# Patient Record
Sex: Male | Born: 1975 | Race: Black or African American | Hispanic: No | Marital: Single | Smoking: Current every day smoker
Health system: Southern US, Community
[De-identification: ages and names within clinical notes are randomized; demographics above are authoritative.]

## PROBLEM LIST (undated history)

## (undated) HISTORY — PX: LEG SURGERY: SHX1003

---

## 1999-01-03 ENCOUNTER — Emergency Department (HOSPITAL_COMMUNITY): Admission: EM | Admit: 1999-01-03 | Discharge: 1999-01-03 | Payer: Self-pay | Admitting: Emergency Medicine

## 1999-01-04 ENCOUNTER — Encounter: Payer: Self-pay | Admitting: Emergency Medicine

## 2003-05-29 ENCOUNTER — Emergency Department (HOSPITAL_COMMUNITY): Admission: EM | Admit: 2003-05-29 | Discharge: 2003-05-29 | Payer: Self-pay | Admitting: Family Medicine

## 2003-08-10 ENCOUNTER — Emergency Department (HOSPITAL_COMMUNITY): Admission: EM | Admit: 2003-08-10 | Discharge: 2003-08-10 | Payer: Self-pay | Admitting: Family Medicine

## 2003-11-13 ENCOUNTER — Emergency Department (HOSPITAL_COMMUNITY): Admission: EM | Admit: 2003-11-13 | Discharge: 2003-11-13 | Payer: Self-pay | Admitting: Emergency Medicine

## 2006-07-24 ENCOUNTER — Emergency Department (HOSPITAL_COMMUNITY): Admission: EM | Admit: 2006-07-24 | Discharge: 2006-07-24 | Payer: Self-pay | Admitting: Family Medicine

## 2006-07-26 ENCOUNTER — Emergency Department (HOSPITAL_COMMUNITY): Admission: EM | Admit: 2006-07-26 | Discharge: 2006-07-26 | Payer: Self-pay | Admitting: Family Medicine

## 2010-06-16 ENCOUNTER — Inpatient Hospital Stay (INDEPENDENT_AMBULATORY_CARE_PROVIDER_SITE_OTHER)
Admission: RE | Admit: 2010-06-16 | Discharge: 2010-06-16 | Disposition: A | Discharge status: Home / Self Care | Payer: Self-pay | Source: Ambulatory Visit | Attending: Family Medicine | Admitting: Family Medicine

## 2010-06-16 DIAGNOSIS — K0510 Chronic gingivitis, plaque induced: Secondary | ICD-10-CM

## 2010-06-16 DIAGNOSIS — K055 Other periodontal diseases: Secondary | ICD-10-CM

## 2010-10-11 ENCOUNTER — Emergency Department (HOSPITAL_COMMUNITY): Payer: Self-pay

## 2010-10-11 ENCOUNTER — Inpatient Hospital Stay (HOSPITAL_COMMUNITY)
Admission: EM | Admit: 2010-10-11 | Discharge: 2010-10-14 | DRG: 981 | Disposition: A | Discharge status: Home / Self Care | Payer: Self-pay | Attending: Surgery | Admitting: Surgery

## 2010-10-11 DIAGNOSIS — F17200 Nicotine dependence, unspecified, uncomplicated: Secondary | ICD-10-CM | POA: Diagnosis present

## 2010-10-11 DIAGNOSIS — S72443B Displaced fracture of lower epiphysis (separation) of unspecified femur, initial encounter for open fracture type I or II: Secondary | ICD-10-CM | POA: Diagnosis present

## 2010-10-11 DIAGNOSIS — F1010 Alcohol abuse, uncomplicated: Secondary | ICD-10-CM | POA: Diagnosis present

## 2010-10-11 DIAGNOSIS — S82109B Unspecified fracture of upper end of unspecified tibia, initial encounter for open fracture type I or II: Secondary | ICD-10-CM | POA: Diagnosis present

## 2010-10-11 DIAGNOSIS — F1210 Cannabis abuse, uncomplicated: Secondary | ICD-10-CM | POA: Diagnosis present

## 2010-10-11 DIAGNOSIS — S82209A Unspecified fracture of shaft of unspecified tibia, initial encounter for closed fracture: Secondary | ICD-10-CM

## 2010-10-11 DIAGNOSIS — S7290XA Unspecified fracture of unspecified femur, initial encounter for closed fracture: Secondary | ICD-10-CM

## 2010-10-11 DIAGNOSIS — D62 Acute posthemorrhagic anemia: Secondary | ICD-10-CM | POA: Diagnosis present

## 2010-10-11 DIAGNOSIS — S82409A Unspecified fracture of shaft of unspecified fibula, initial encounter for closed fracture: Secondary | ICD-10-CM

## 2010-10-11 DIAGNOSIS — S81009A Unspecified open wound, unspecified knee, initial encounter: Principal | ICD-10-CM | POA: Diagnosis present

## 2010-10-11 DIAGNOSIS — Y998 Other external cause status: Secondary | ICD-10-CM

## 2010-10-11 LAB — TYPE AND SCREEN

## 2010-10-11 LAB — POCT I-STAT, CHEM 8
Creatinine, Ser: 1.6 mg/dL — ABNORMAL HIGH (ref 0.50–1.35)
Glucose, Bld: 99 mg/dL (ref 70–99)
Hemoglobin: 16 g/dL (ref 13.0–17.0)
TCO2: 21 mmol/L (ref 0–100)

## 2010-10-12 ENCOUNTER — Inpatient Hospital Stay (HOSPITAL_COMMUNITY): Payer: Self-pay

## 2010-10-12 ENCOUNTER — Emergency Department (HOSPITAL_COMMUNITY): Payer: Self-pay

## 2010-10-12 LAB — CBC
HCT: 35.7 % — ABNORMAL LOW (ref 39.0–52.0)
MCHC: 33.6 g/dL (ref 30.0–36.0)
RDW: 12.8 % (ref 11.5–15.5)

## 2010-10-12 LAB — BASIC METABOLIC PANEL
BUN: 13 mg/dL (ref 6–23)
Creatinine, Ser: 1.19 mg/dL (ref 0.50–1.35)
GFR calc Af Amer: >60 mL/min (ref >60)
GFR calc non Af Amer: >60 mL/min (ref >60)

## 2010-10-12 LAB — SURGICAL PCR SCREEN
MRSA, PCR: NEGATIVE
Staphylococcus aureus: POSITIVE — AB

## 2010-10-13 LAB — BASIC METABOLIC PANEL
BUN: 9 mg/dL (ref 6–23)
Calcium: 8.1 mg/dL — ABNORMAL LOW (ref 8.4–10.5)
GFR calc Af Amer: >60 mL/min (ref >60)
GFR calc non Af Amer: >60 mL/min (ref >60)
Potassium: 3.8 mEq/L (ref 3.5–5.1)

## 2010-10-13 LAB — CBC
HCT: 27.6 % — ABNORMAL LOW (ref 39.0–52.0)
MCHC: 35.5 g/dL (ref 30.0–36.0)
RDW: 13 % (ref 11.5–15.5)

## 2010-10-14 LAB — CBC
Hemoglobin: 9.5 g/dL — ABNORMAL LOW (ref 13.0–17.0)
MCHC: 34.4 g/dL (ref 30.0–36.0)
Platelets: 163 10*3/uL (ref 150–400)
RDW: 12.7 % (ref 11.5–15.5)
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
## 2010-10-29 ENCOUNTER — Emergency Department (HOSPITAL_COMMUNITY)
Admission: EM | Admit: 2010-10-29 | Discharge: 2010-10-29 | Disposition: A | Discharge status: Home / Self Care | Payer: Self-pay | Attending: Emergency Medicine | Admitting: Emergency Medicine

## 2010-10-29 DIAGNOSIS — Z9889 Other specified postprocedural states: Secondary | ICD-10-CM | POA: Insufficient documentation

## 2010-10-29 DIAGNOSIS — Z79899 Other long term (current) drug therapy: Secondary | ICD-10-CM | POA: Insufficient documentation

## 2010-10-29 DIAGNOSIS — M79609 Pain in unspecified limb: Secondary | ICD-10-CM | POA: Insufficient documentation

## 2010-10-30 ENCOUNTER — Other Ambulatory Visit: Payer: Self-pay | Admitting: Orthopedic Surgery

## 2010-10-30 ENCOUNTER — Encounter (INDEPENDENT_AMBULATORY_CARE_PROVIDER_SITE_OTHER): Payer: Self-pay | Admitting: *Deleted

## 2010-10-30 DIAGNOSIS — M79609 Pain in unspecified limb: Secondary | ICD-10-CM

## 2010-10-30 DIAGNOSIS — R609 Edema, unspecified: Secondary | ICD-10-CM
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
## 2010-10-31 ENCOUNTER — Telehealth (INDEPENDENT_AMBULATORY_CARE_PROVIDER_SITE_OTHER): Payer: Self-pay | Admitting: Physician Assistant

## 2010-10-31 NOTE — Telephone Encounter (Signed)
[facility] RN, Zella Ball called to get approval for additional RN visit and approval was given

## 2010-11-12 LAB — POCT RAPID STREP A: Streptococcus, Group A Screen (Direct): NEGATIVE

## 2010-11-12 LAB — CBC
Hemoglobin: 15
Platelets: 190
RDW: 14

## 2010-11-12 LAB — DIFFERENTIAL
Basophils Absolute: 0
Basophils Relative: 0
Lymphocytes Relative: 12
Neutro Abs: 6.9
Neutrophils Relative %: 72

## 2010-11-12 LAB — POCT INFECTIOUS MONO SCREEN: Mono Screen: NEGATIVE

## 2011-01-21 ENCOUNTER — Ambulatory Visit: Payer: Self-pay | Admitting: Rehabilitation

## 2011-02-03 ENCOUNTER — Ambulatory Visit: Payer: Self-pay | Attending: Orthopedic Surgery

## 2011-02-03 DIAGNOSIS — R262 Difficulty in walking, not elsewhere classified: Secondary | ICD-10-CM | POA: Insufficient documentation

## 2011-02-03 DIAGNOSIS — M25669 Stiffness of unspecified knee, not elsewhere classified: Secondary | ICD-10-CM | POA: Insufficient documentation

## 2011-02-03 DIAGNOSIS — M25569 Pain in unspecified knee: Secondary | ICD-10-CM | POA: Insufficient documentation

## 2011-02-03 DIAGNOSIS — IMO0001 Reserved for inherently not codable concepts without codable children: Secondary | ICD-10-CM | POA: Insufficient documentation

## 2011-02-16 ENCOUNTER — Ambulatory Visit: Payer: Self-pay | Admitting: Physical Therapy

## 2011-02-24 ENCOUNTER — Ambulatory Visit: Payer: Self-pay | Admitting: Physical Therapy

## 2011-02-26 ENCOUNTER — Ambulatory Visit: Payer: Self-pay | Admitting: Physical Therapy

## 2011-03-03 ENCOUNTER — Ambulatory Visit: Payer: Self-pay | Attending: Orthopedic Surgery | Admitting: Physical Therapy

## 2011-03-03 DIAGNOSIS — R262 Difficulty in walking, not elsewhere classified: Secondary | ICD-10-CM | POA: Insufficient documentation

## 2011-03-03 DIAGNOSIS — M25569 Pain in unspecified knee: Secondary | ICD-10-CM | POA: Insufficient documentation

## 2011-03-03 DIAGNOSIS — M25669 Stiffness of unspecified knee, not elsewhere classified: Secondary | ICD-10-CM | POA: Insufficient documentation

## 2011-03-03 DIAGNOSIS — IMO0001 Reserved for inherently not codable concepts without codable children: Secondary | ICD-10-CM | POA: Insufficient documentation

## 2011-03-05 ENCOUNTER — Ambulatory Visit: Payer: Self-pay | Admitting: Physical Therapy

## 2011-03-11 ENCOUNTER — Encounter: Payer: Self-pay | Admitting: Physical Therapy

## 2011-03-12 ENCOUNTER — Ambulatory Visit: Payer: Self-pay | Admitting: Physical Therapy

## 2011-03-16 ENCOUNTER — Ambulatory Visit: Payer: Self-pay | Admitting: Physical Therapy

## 2011-03-19 ENCOUNTER — Encounter: Payer: Self-pay | Admitting: Physical Therapy

## 2011-03-19 ENCOUNTER — Ambulatory Visit: Payer: Self-pay | Admitting: Physical Therapy

## 2011-03-24 ENCOUNTER — Ambulatory Visit: Payer: Self-pay | Admitting: Rehabilitation

## 2011-03-24 ENCOUNTER — Encounter: Payer: Self-pay | Admitting: Physical Therapy

## 2011-03-25 ENCOUNTER — Ambulatory Visit: Payer: Self-pay | Admitting: Rehabilitation

## 2011-03-26 ENCOUNTER — Encounter: Payer: Self-pay | Admitting: Physical Therapy

## 2011-03-26 ENCOUNTER — Encounter: Payer: Self-pay | Admitting: Rehabilitative and Restorative Service Providers"

## 2011-03-30 ENCOUNTER — Ambulatory Visit: Payer: Self-pay | Admitting: Physical Therapy

## 2011-03-31 ENCOUNTER — Ambulatory Visit: Payer: Self-pay | Attending: Orthopedic Surgery | Admitting: Physical Therapy

## 2011-03-31 DIAGNOSIS — R262 Difficulty in walking, not elsewhere classified: Secondary | ICD-10-CM | POA: Insufficient documentation

## 2011-03-31 DIAGNOSIS — M25569 Pain in unspecified knee: Secondary | ICD-10-CM | POA: Insufficient documentation

## 2011-03-31 DIAGNOSIS — IMO0001 Reserved for inherently not codable concepts without codable children: Secondary | ICD-10-CM | POA: Insufficient documentation

## 2011-03-31 DIAGNOSIS — M25669 Stiffness of unspecified knee, not elsewhere classified: Secondary | ICD-10-CM | POA: Insufficient documentation

## 2011-04-01 ENCOUNTER — Encounter: Payer: Self-pay | Admitting: Rehabilitation

## 2011-04-02 ENCOUNTER — Ambulatory Visit: Payer: Self-pay | Admitting: Rehabilitation

## 2011-04-06 ENCOUNTER — Ambulatory Visit: Payer: Self-pay | Admitting: Rehabilitation

## 2011-04-08 ENCOUNTER — Ambulatory Visit: Payer: Self-pay | Admitting: Rehabilitation

## 2011-04-13 ENCOUNTER — Ambulatory Visit: Payer: Self-pay | Admitting: Rehabilitation

## 2011-04-15 ENCOUNTER — Encounter: Payer: Self-pay | Admitting: Rehabilitation

## 2013-06-30 ENCOUNTER — Emergency Department (INDEPENDENT_AMBULATORY_CARE_PROVIDER_SITE_OTHER)
Admission: EM | Admit: 2013-06-30 | Discharge: 2013-06-30 | Disposition: A | Discharge status: Home / Self Care | Payer: Self-pay | Source: Home / Self Care | Attending: Emergency Medicine | Admitting: Emergency Medicine

## 2013-06-30 ENCOUNTER — Encounter (HOSPITAL_COMMUNITY): Payer: Self-pay | Admitting: Emergency Medicine

## 2013-06-30 DIAGNOSIS — M25561 Pain in right knee: Secondary | ICD-10-CM

## 2013-06-30 DIAGNOSIS — M25569 Pain in unspecified knee: Secondary | ICD-10-CM

## 2013-06-30 DIAGNOSIS — B86 Scabies: Secondary | ICD-10-CM

## 2013-06-30 MED ORDER — PERMETHRIN 5 % EX CREA: 1.0000 "application " | TOPICAL_CREAM | Freq: Once | CUTANEOUS | Status: DC

## 2013-06-30 MED ORDER — IBUPROFEN 800 MG PO TABS
800.0000 mg | ORAL_TABLET | Freq: Once | ORAL | Status: AC
  Administered 2013-06-30: 800 mg via ORAL

## 2013-06-30 MED ORDER — IBUPROFEN 800 MG PO TABS
ORAL_TABLET | ORAL | Status: AC
  Filled 2013-06-30: qty 1
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
## 2013-06-30 NOTE — Discharge Instructions (Signed)
Wear ace wrap as needed for comfort. Use Ibuprofen 600-800 mg three times a day as needed for the pain. A heating pad may help as well. Use the Permethrin Cream as directed and repeat in 10-14 days as needed. See instructions below regarding home care after scabies infestation.  Knee Pain Knee pain can be a result of an injury or other medical conditions. Treatment will depend on the cause of your pain. HOME CARE  Only take medicine as told by your doctor.  Keep a healthy weight. Being overweight can make the knee hurt more.  Stretch before exercising or playing sports.  If there is constant knee pain, change the way you exercise. Ask your doctor for advice.  Make sure shoes fit well. Choose the right shoe for the sport or activity.  Protect your knees. Wear kneepads if needed.  Rest when you are tired. GET HELP RIGHT AWAY IF:   Your knee pain does not stop.  Your knee pain does not get better.  Your knee joint feels hot to the touch.  You have a fever. MAKE SURE YOU:   Understand these instructions.  Will watch this condition.  Will get help right away if you are not doing well or get worse. Document Released: 04/10/2008 Document Revised: 04/06/2011 Document Reviewed: 04/10/2008 [facility] LP[facility] Patient Information 2014 [employer], [place].  Scabies Scabies are small bugs (mites) that burrow under the skin and cause red bumps and severe itching. These bugs can only be seen with a microscope. Scabies are highly contagious. They can spread easily from person to person by direct contact. They are also spread through sharing clothing or linens that have the scabies mites living in them. It is not unusual for an entire family to become infected through shared towels, clothing, or bedding.  HOME CARE INSTRUCTIONS   Your caregiver may prescribe a cream or lotion to kill the mites. If cream is prescribed, massage the cream into the entire body from the neck to the bottom of both feet. Also  massage the cream into the scalp and face if your child is less than 36 year old. Avoid the eyes and mouth. Do not wash your hands after application.  Leave the cream on for 8 to 12 hours. Your child should bathe or shower after the 8 to 12 hour application period. Sometimes it is helpful to apply the cream to your child right before bedtime.  One treatment is usually effective and will eliminate approximately 95% of infestations. For severe cases, your caregiver may decide to repeat the treatment in 1 week. Everyone in your household should be treated with one application of the cream.  New rashes or burrows should not appear within 24 to 48 hours after successful treatment. However, the itching and rash may last for 2 to 4 weeks after successful treatment. Your caregiver may prescribe a medicine to help with the itching or to help the rash go away more quickly.  Scabies can live on clothing or linens for up to 3 days. All of your child's recently used clothing, towels, stuffed toys, and bed linens should be washed in hot water and then dried in a dryer for at least 20 minutes on high heat. Items that cannot be washed should be enclosed in a plastic bag for at least 3 days.  To help relieve itching, bathe your child in a cool bath or apply cool washcloths to the affected areas.  Your child may return to school after treatment with the prescribed cream.  SEEK MEDICAL CARE IF:   The itching persists longer than 4 weeks after treatment.  The rash spreads or becomes infected. Signs of infection include red blisters or yellow-tan crust. Document Released: 01/12/2005 Document Revised: 04/06/2011 Document Reviewed: 05/23/2008 [facility] Patient Information 2014 [place], [place].
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
## 2013-07-03 NOTE — ED Provider Notes (Signed)
Medical screening examination/treatment/procedure(s) were performed by non-physician practitioner and as supervising physician I was immediately available for consultation/collaboration.  Blayze Haen, M.D.  Agnes Brightbill C Denham Mose, MD 07/03/13 1344 

## 2013-11-06 ENCOUNTER — Encounter (HOSPITAL_COMMUNITY): Payer: Self-pay | Admitting: Emergency Medicine

## 2013-11-06 ENCOUNTER — Emergency Department (HOSPITAL_COMMUNITY)
Admission: EM | Admit: 2013-11-06 | Discharge: 2013-11-07 | Disposition: A | Discharge status: Home / Self Care | Payer: Self-pay | Attending: Emergency Medicine | Admitting: Emergency Medicine

## 2013-11-06 DIAGNOSIS — H209 Unspecified iridocyclitis: Secondary | ICD-10-CM | POA: Insufficient documentation

## 2013-11-06 DIAGNOSIS — Z720 Tobacco use: Secondary | ICD-10-CM | POA: Insufficient documentation

## 2013-11-06 MED ORDER — FLUORESCEIN SODIUM 1 MG OP STRP
1.0000 | ORAL_STRIP | Freq: Once | OPHTHALMIC | Status: AC
  Administered 2013-11-06: 1 via OPHTHALMIC
  Filled 2013-11-06: qty 1

## 2013-11-06 MED ORDER — HYDROCODONE-ACETAMINOPHEN 5-325 MG PO TABS
2.0000 | ORAL_TABLET | Freq: Once | ORAL | Status: AC
  Administered 2013-11-06: 2 via ORAL
  Filled 2013-11-06: qty 2

## 2013-11-06 MED ORDER — TETRACAINE HCL 0.5 % OP SOLN
2.0000 [drp] | Freq: Once | OPHTHALMIC | Status: AC
  Administered 2013-11-06: 2 [drp] via OPHTHALMIC
  Filled 2013-11-06: qty 2
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
## 2013-11-07 MED ORDER — HYDROCODONE-ACETAMINOPHEN 5-325 MG PO TABS: 1.0000 | ORAL_TABLET | Freq: Two times a day (BID) | ORAL | Status: DC | PRN
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
## 2014-11-13 ENCOUNTER — Encounter (HOSPITAL_COMMUNITY): Payer: Self-pay

## 2014-11-13 ENCOUNTER — Emergency Department (HOSPITAL_COMMUNITY)
Admission: EM | Admit: 2014-11-13 | Discharge: 2014-11-13 | Disposition: A | Discharge status: Home / Self Care | Payer: Self-pay | Attending: Emergency Medicine | Admitting: Emergency Medicine

## 2014-11-13 ENCOUNTER — Emergency Department (HOSPITAL_COMMUNITY): Payer: Self-pay

## 2014-11-13 ENCOUNTER — Emergency Department (HOSPITAL_COMMUNITY)
Admission: EM | Admit: 2014-11-13 | Discharge: 2014-11-13 | Disposition: A | Discharge status: Nursing Home (Includes ICF) | Payer: Self-pay | Source: Home / Self Care | Attending: Emergency Medicine | Admitting: Emergency Medicine

## 2014-11-13 ENCOUNTER — Encounter (HOSPITAL_COMMUNITY): Payer: Self-pay | Admitting: Emergency Medicine

## 2014-11-13 DIAGNOSIS — J36 Peritonsillar abscess: Secondary | ICD-10-CM | POA: Insufficient documentation

## 2014-11-13 DIAGNOSIS — Z720 Tobacco use: Secondary | ICD-10-CM | POA: Insufficient documentation

## 2014-11-13 DIAGNOSIS — Z79899 Other long term (current) drug therapy: Secondary | ICD-10-CM | POA: Insufficient documentation

## 2014-11-13 LAB — I-STAT CHEM 8, ED
BUN: 19 mg/dL (ref 6–20)
CALCIUM ION: 1.18 mmol/L (ref 1.12–1.23)
CHLORIDE: 103 mmol/L (ref 101–111)
Creatinine, Ser: 1 mg/dL (ref 0.61–1.24)
GLUCOSE: 87 mg/dL (ref 65–99)
HCT: 55 % — ABNORMAL HIGH (ref 39.0–52.0)
Hemoglobin: 18.7 g/dL — ABNORMAL HIGH (ref 13.0–17.0)
Potassium: 4.1 mmol/L (ref 3.5–5.1)
SODIUM: 143 mmol/L (ref 135–145)
TCO2: 28 mmol/L (ref 0–100)

## 2014-11-13 LAB — RAPID STREP SCREEN (MED CTR MEBANE ONLY): STREPTOCOCCUS, GROUP A SCREEN (DIRECT): POSITIVE — AB

## 2014-11-13 LAB — POCT RAPID STREP A: Streptococcus, Group A Screen (Direct): NEGATIVE

## 2014-11-13 MED ORDER — IOHEXOL 300 MG/ML  SOLN
75.0000 mL | Freq: Once | INTRAMUSCULAR | Status: AC | PRN
  Administered 2014-11-13: 75 mL via INTRAVENOUS

## 2014-11-13 MED ORDER — AMOXICILLIN 400 MG/5ML PO SUSR: ORAL | Status: AC

## 2014-11-13 MED ORDER — HYDROCODONE-ACETAMINOPHEN 7.5-325 MG/15ML PO SOLN: 15.0000 mL | Freq: Four times a day (QID) | ORAL | Status: AC | PRN

## 2014-11-13 MED ORDER — MORPHINE SULFATE (PF) 4 MG/ML IV SOLN
4.0000 mg | Freq: Once | INTRAVENOUS | Status: AC
Start: 2014-11-13 — End: 2014-11-13
  Administered 2014-11-13: 4 mg via INTRAVENOUS
  Filled 2014-11-13: qty 1

## 2014-11-13 MED ORDER — PENICILLIN G BENZATHINE 1200000 UNIT/2ML IM SUSP
1.2000 10*6.[IU] | Freq: Once | INTRAMUSCULAR | Status: AC
  Administered 2014-11-13: 1.2 10*6.[IU] via INTRAMUSCULAR
  Filled 2014-11-13: qty 2

## 2014-11-13 MED ORDER — ONDANSETRON HCL 4 MG/2ML IJ SOLN
4.0000 mg | Freq: Once | INTRAMUSCULAR | Status: AC
Start: 2014-11-13 — End: 2014-11-13
  Administered 2014-11-13: 4 mg via INTRAVENOUS
  Filled 2014-11-13: qty 2

## 2014-11-13 MED ORDER — DEXAMETHASONE SODIUM PHOSPHATE 10 MG/ML IJ SOLN
20.0000 mg | Freq: Once | INTRAMUSCULAR | Status: AC
  Administered 2014-11-13: 20 mg via INTRAVENOUS
  Filled 2014-11-13: qty 2

## 2014-11-13 MED ORDER — SODIUM CHLORIDE 0.9 % IV BOLUS (SEPSIS)
1000.0000 mL | Freq: Once | INTRAVENOUS | Status: AC
  Administered 2014-11-13: 1000 mL via INTRAVENOUS

## 2014-11-13 NOTE — ED Provider Notes (Signed)
CSN: [id number]     Arrival date & time 11/13/14  1627 History  By signing my name below, I, Austin Foster, attest that this documentation has been prepared under the direction and in the presence of non-physician practitioner, Austin CaptainAbigail Sunny Aguon, PA-C. Electronically Signed: Freida Busmaniana Foster, Scribe. 11/13/2014. 11:23 PM.    Chief Complaint  Patient presents with  . Sore Throat    The history is provided by the patient. No language interpreter was used.     HPI Comments:  Austin Foster is a 39 y.o. male with a h/o strep, who presents to the Emergency Department complaining of a sore throat with 8/10 pain, onset ~1 week ago. He states it seemed to be improving but worsened upon waking this AM. Pt reports greater pain on the right side and associated voice change. No alleviating factors noted. Pt was evaluated at an Urgent Care earlier today for his symptom and was diagnosed with peritonsillar and advised to come to the ED.   History reviewed. No pertinent past medical history. Past Surgical History  Procedure Laterality Date  . Leg surgery     No family history on file. Social History  Substance Use Topics  . Smoking status: Current Every Day Smoker  . Smokeless tobacco: None  . Alcohol Use: Yes    Review of Systems  Constitutional: Negative for fever and chills.  HENT: Positive for sore throat and voice change.   Respiratory: Negative for shortness of breath.   Cardiovascular: Negative for chest pain.    Allergies  Review of patient's allergies indicates no known allergies.  Home Medications   Prior to Admission medications   Medication Sig Start Date End Date Taking? Authorizing Provider  amoxicillin (AMOXIL) 400 MG/5ML suspension Take 12 mL 3 times a day for the first 3 days. Then take 6 mL 3 times a day for the next 7 days. 11/14/14 11/24/14  Austin CaptainAbigail Mailen Newborn, PA-C  diphenhydramine-acetaminophen (TYLENOL PM) 25-500 MG TABS Take 1 tablet by mouth at bedtime.     Historical Provider, MD  HYDROcodone-acetaminophen (HYCET) 7.5-325 mg/15 ml solution Take 15 mLs by mouth 4 (four) times daily as needed for moderate pain. 11/13/14 11/13/15  Austin CaptainAbigail Jynesis Nakamura, PA-C   BP 99/59 mmHg  Pulse 54  Temp(Src) 98.5 F (36.9 C) (Oral)  Resp 14  SpO2 99% Physical Exam  Constitutional: He is oriented to person, place, and time. He appears well-developed and well-nourished. No distress.  HENT:  Head: Normocephalic and atraumatic.  Peritonsillar abscess on the right; uvular deviation to the left, trismus, and unilateral swelling noted on the right. Pt is not tolerating secretions well; drooling  Eyes: Conjunctivae are normal.  Cardiovascular: Normal rate.   Pulmonary/Chest: Effort normal.  Abdominal: He exhibits no distension.  Neurological: He is alert and oriented to person, place, and time.  Skin: Skin is warm and dry.  Psychiatric: He has a normal mood and affect.  Nursing note and vitals reviewed.   ED Course  Procedures   DIAGNOSTIC STUDIES:  Oxygen Saturation is 100% on RA, normal by my interpretation.    COORDINATION OF CARE:  5:04 PM Will refer pt to ENT to have abscess drained. Discussed treatment plan with pt at bedside and pt agreed to plan.  Labs Review Labs Reviewed  RAPID STREP SCREEN (NOT AT [facility]) - Abnormal; Notable for the following:    Streptococcus, Group A Screen (Direct) POSITIVE (*)    All other components within normal limits  I-STAT CHEM 8, ED - Abnormal; Notable  for the following:    Hemoglobin 18.7 (*)    HCT 55.0 (*)    All other components within normal limits  CULTURE, GROUP A STREP    Imaging Review Ct Soft Tissue Neck W Contrast  11/13/2014  CLINICAL DATA:  Sore throat x1 week EXAM: CT NECK WITH CONTRAST TECHNIQUE: Multidetector CT imaging of the neck was performed using the standard protocol following the bolus administration of intravenous contrast. CONTRAST:  75mL OMNIPAQUE IOHEXOL 300 MG/ML  SOLN COMPARISON:   None. FINDINGS: Pharynx and larynx: 2.7 cm right peritonsillar low-attenuation loculated process probably abscess. There is some peripheral enhancement. This results in mild mass effect upon the regional oropharynx. Aryepiglottic folds are normal. Salivary glands: Symmetric, unremarkable. Thyroid: Normal Lymph nodes: No cervical adenopathy localized. Vascular: Normal Limited intracranial: Normal Visualized orbits: Normal Mastoids and visualized paranasal sinuses: Hypoplastic left frontal sinus. Otherwise negative. Skeleton: Streak artifact from dental restorations. Otherwise negative. Upper chest: Unremarkable IMPRESSION: 1. 2.7 cm multiloculated right peritonsillar abscess. Electronically Signed   By: Corlis Leak M.D.   On: 11/13/2014 18:35   I have personally reviewed and evaluated these images and lab results as part of my medical decision-making.   EKG Interpretation None      MDM   Final diagnoses:  Tonsillar abscess    Patient with apparent PTA. Will obtain CT. Pain meds and decadron given. His strep test is positive.   Filed Vitals:   11/13/14 1630 11/13/14 1804 11/13/14 2032  BP: 117/79 173/111 99/59  Pulse: 71 76 54  Temp: 99.5 F (37.5 C) 98.5 F (36.9 C)   TempSrc: Oral Oral   Resp: SpO2: 100% 99% 99%   I spoke with Dr. Lazarus Foster. Patient will be seen tomorrow in the office for iand d. Tolerating secretions. I believe pressure has dropped form pain meds.  Patient appears improved. A&O.   D/C with med orders per Dr. Lazarus Foster (see AVS.) Discussed reasons to seek immediate medical care in the office. Austin Foster, personally performed the services described in this documentation. All medical record entries made by the scribe were at my direction and in my presence.  I have reviewed the chart and discharge instructions and agree that the record reflects my personal performance and is accurate and complete. Austin Foster.  11/13/2014. 11:26 PM.        Austin Captain, PA-C 11/13/14 1610  Austin Hutching, MD 11/16/14 1546

## 2014-11-13 NOTE — Discharge Instructions (Signed)
Peritonsillar Abscess °A peritonsillar abscess is a collection of yellowish-white fluid (pus) in the back of the throat behind the tonsils. It usually occurs when an infection of the throat or tonsils (tonsillitis) spreads into the tissues around the tonsils. °CAUSES °The infection that leads to a peritonsillar abscess is usually caused by streptococcal bacteria.  °SIGNS AND SYMPTOMS °· Sore throat, often with pain on just one side. °· Swelling and tenderness of the glands (lymph nodes) in the neck. °· Difficulty swallowing. °· Difficulty opening your mouth. °· Fever. °· Chills. °· Drooling because of difficulty swallowing saliva. °· Headache. °· Changes in your voice. °· Bad breath. °DIAGNOSIS °Your health care provider will take your medical history and do a physical exam. Imaging tests may be done, such as an ultrasound or CT scan. A sample of pus may be removed from the abscess using a needle (needle aspiration) or by swabbing the back of your throat. This sample will be sent to a lab for testing. °TREATMENT °Treatment usually involves draining the pus from the abscess. This may be done through needle aspiration or by making an incision in the abscess. You will also likely need to take antibiotic medicine. °HOME CARE INSTRUCTIONS °· Rest as much as possible and get plenty of sleep. °· Take medicines only as directed by your health care provider. °· If you were prescribed an antibiotic medicine, finish it all even if you start to feel better. °· If your abscess was drained by your health care provider, gargle with a mixture of salt and warm water: °¨ Mix 1 tsp of salt in 8 oz of warm water. °¨ Gargle with this mixture four times per day or as needed for comfort. °¨ Do not swallow this mixture. °· Drink plenty of fluids. °· While your throat is sore, eat soft or liquid foods, such as frozen ice pops and ice cream. °· Keep all follow-up visits as directed by your health care provider. This is important. °SEEK  MEDICAL CARE IF: °· You have increased pain, swelling, redness, or drainage in your throat. °· You develop a headache, a lack of energy (lethargy), or generalized feelings of illness. °· You have a fever. °· You feel dizzy. °· You have difficulty swallowing or eating. °· You show signs of becoming dehydrated, such as: °¨ Light-headedness when standing. °¨ Decreased urine output. °¨ A fast heart rate. °¨ Dry mouth. °SEEK IMMEDIATE MEDICAL CARE IF:  °· You have difficulty talking or breathing, or you find it easier to breathe when you lean forward. °· You are coughing up blood or vomiting blood. °· You have severe throat pain that is not helped by medicines. °· You start to drool. °  °This information is not intended to replace advice given to you by your health care provider. Make sure you discuss any questions you have with your health care provider. °  °Document Released: 01/12/2005 Document Revised: 02/02/2014 Document Reviewed: 08/28/2013 °Elsevier Interactive Patient Education ©2016 [employer] ° °

## 2014-11-13 NOTE — ED Notes (Signed)
Pt reports sore throat x 1 week. Airway intact. Denies fever/chills.

## 2014-11-13 NOTE — ED Provider Notes (Signed)
CSN: [id number]     Arrival date & time 11/13/14  1524 History   First MD Initiated Contact with Patient 11/13/14 1537     Chief Complaint  Patient presents with  . Sore Throat   (Consider location/radiation/quality/duration/timing/severity/associated sxs/prior Treatment) HPI He is a 39 year old man here for evaluation of sore throat. He states the sore throat has been present for about a week. It was getting better, but became acutely worse this morning. He states he is able to swallow his saliva, but he is also spitting a lot. He denies any nasal congestion or rhinorrhea. No fevers or chills. No cough or shortness of breath. He does report some right ear pain.  History reviewed. No pertinent past medical history. Past Surgical History  Procedure Laterality Date  . Leg surgery     No family history on file. Social History  Substance Use Topics  . Smoking status: Current Every Day Smoker  . Smokeless tobacco: None  . Alcohol Use: Yes    Review of Systems As in history of present illness Allergies  Review of patient's allergies indicates no known allergies.  Home Medications   Prior to Admission medications   Medication Sig Start Date End Date Taking? Authorizing Provider  diphenhydramine-acetaminophen (TYLENOL PM) 25-500 MG TABS Take 1 tablet by mouth at bedtime.    Historical Provider, MD  HYDROcodone-acetaminophen (NORCO/VICODIN) 5-325 MG per tablet Take 1 tablet by mouth 2 (two) times daily as needed for severe pain. 11/07/13   Tomasita CrumbleAdeleke Oni, MD   Meds Ordered and Administered this Visit  Medications - No data to display  BP 110/71 mmHg  Pulse 66  Temp(Src) 99 F (37.2 C) (Oral)  Resp 24  SpO2 100% No data found.   Physical Exam  Constitutional: He is oriented to person, place, and time. He appears well-developed and well-nourished. No distress.  HENT:  Mouth/Throat: No oropharyngeal exudate.  There is significant swelling and erythema of the right anterior  tonsillar pillar. Left tonsil is mildly erythematous. Left TM is normal. Right TM is retracted.  Neck: Neck supple.  Cardiovascular: Normal rate.   Pulmonary/Chest: Effort normal.  Lymphadenopathy:    He has no cervical adenopathy.  Neurological: He is alert and oriented to person, place, and time.    ED Course  Procedures (including critical care time)  Labs Review Labs Reviewed  POCT RAPID STREP A    Imaging Review No results found.    MDM   1. Peritonsillar abscess    History and exam is concerning for peritonsillar abscess. There is no airway compromise at this time. [facility] additional evaluation and management.    Charm RingsErin J Janean Eischen, MD 11/13/14 [phone]

## 2014-11-13 NOTE — ED Notes (Signed)
Sore throat for a week.  Thought to be improving, but today woke with the worst sore throat.

## 2014-11-17 LAB — CULTURE, GROUP A STREP

## 2014-11-27 ENCOUNTER — Emergency Department (HOSPITAL_COMMUNITY)
Admission: EM | Admit: 2014-11-27 | Discharge: 2014-11-27 | Disposition: A | Discharge status: Home / Self Care | Payer: Self-pay | Attending: Emergency Medicine | Admitting: Emergency Medicine

## 2014-11-27 ENCOUNTER — Emergency Department (HOSPITAL_COMMUNITY): Payer: Self-pay

## 2014-11-27 ENCOUNTER — Encounter (HOSPITAL_COMMUNITY): Payer: Self-pay | Admitting: *Deleted

## 2014-11-27 DIAGNOSIS — Y9289 Other specified places as the place of occurrence of the external cause: Secondary | ICD-10-CM | POA: Insufficient documentation

## 2014-11-27 DIAGNOSIS — M25461 Effusion, right knee: Secondary | ICD-10-CM

## 2014-11-27 DIAGNOSIS — Y9389 Activity, other specified: Secondary | ICD-10-CM | POA: Insufficient documentation

## 2014-11-27 DIAGNOSIS — S8991XA Unspecified injury of right lower leg, initial encounter: Secondary | ICD-10-CM | POA: Insufficient documentation

## 2014-11-27 DIAGNOSIS — W1839XA Other fall on same level, initial encounter: Secondary | ICD-10-CM | POA: Insufficient documentation

## 2014-11-27 DIAGNOSIS — Z720 Tobacco use: Secondary | ICD-10-CM | POA: Insufficient documentation

## 2014-11-27 DIAGNOSIS — Z79899 Other long term (current) drug therapy: Secondary | ICD-10-CM | POA: Insufficient documentation

## 2014-11-27 DIAGNOSIS — Y998 Other external cause status: Secondary | ICD-10-CM | POA: Insufficient documentation

## 2014-11-27 MED ORDER — OXYCODONE-ACETAMINOPHEN 5-325 MG PO TABS
ORAL_TABLET | ORAL | Status: DC
Start: 2014-11-27 — End: 2014-11-27
  Filled 2014-11-27: qty 1

## 2014-11-27 MED ORDER — OXYCODONE-ACETAMINOPHEN 5-325 MG PO TABS
1.0000 | ORAL_TABLET | Freq: Once | ORAL | Status: AC
  Administered 2014-11-27: 1 via ORAL

## 2014-11-27 MED ORDER — RABIES VACCINE, PCEC IM SUSR: 1.0000 mL | Freq: Once | INTRAMUSCULAR | Status: DC

## 2014-11-27 NOTE — Discharge Instructions (Signed)
Knee Effusion °Knee effusion means that you have extra fluid in your knee. This can cause pain. Your knee may be more difficult to bend and move. °HOME CARE °· Use crutches as told by your doctor. °· Wear a knee brace as told by your doctor. °· Apply ice to the swollen area: °¨ Put ice in a plastic bag. °¨ Place a towel between your skin and the bag. °¨ Leave the ice on for 20 minutes, 2-3 times per day. °· Keep your knee raised (elevated) when you are sitting or lying down. °· Take medicines only as told by your doctor. °· Do any rehabilitation or strengthening exercises as told by your doctor. °· Rest your knee as told by your doctor. You may start doing your normal activities again when your doctor says it is okay. °· Keep all follow-up visits as told by your doctor. This is important. °GET HELP IF:  °· You continue to have pain in your knee. °GET HELP RIGHT AWAY IF: °· You have increased swelling or redness of your knee. °· You have severe pain in your knee. °· You have a fever. °  °This information is not intended to replace advice given to you by your health care provider. Make sure you discuss any questions you have with your health care provider. °  °Document Released: 02/14/2010 Document Revised: 02/02/2014 Document Reviewed: 08/28/2013 °Elsevier Interactive Patient Education ©2016 Elsevier Inc. ° °
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
## 2014-12-05 ENCOUNTER — Ambulatory Visit: Payer: Self-pay

## 2017-09-28 ENCOUNTER — Encounter (HOSPITAL_COMMUNITY): Payer: Self-pay | Admitting: Emergency Medicine

## 2017-09-28 ENCOUNTER — Ambulatory Visit (HOSPITAL_COMMUNITY)
Admission: EM | Admit: 2017-09-28 | Discharge: 2017-09-28 | Disposition: A | Discharge status: Home / Self Care | Payer: Self-pay | Attending: Family Medicine | Admitting: Family Medicine

## 2017-09-28 DIAGNOSIS — H1032 Unspecified acute conjunctivitis, left eye: Secondary | ICD-10-CM

## 2017-09-28 MED ORDER — OFLOXACIN 0.3 % OP SOLN: OPHTHALMIC | 0 refills | Status: AC

## 2017-09-28 MED ORDER — POLYETHYL GLYCOL-PROPYL GLYCOL 0.4-0.3 % OP GEL: 1.0000 "application " | Freq: Every evening | OPHTHALMIC | 0 refills | Status: AC | PRN

## 2017-09-28 MED ORDER — TETRACAINE HCL 0.5 % OP SOLN
OPHTHALMIC | Status: AC
Start: 2017-09-28 — End: ?
  Filled 2017-09-28: qty 4

## 2017-09-28 NOTE — ED Triage Notes (Signed)
Pt here for redness and irritation to left eye 

## 2017-09-28 NOTE — ED Provider Notes (Signed)
[facility]    CSN: [id number] Arrival date & time: 09/28/17  1221     History   Chief Complaint Chief Complaint  Patient presents with  . Conjunctivitis    HPI Austin Foster is a 42 y.o. male.   42 year old male comes in for 4 day history of left eye irritation. Has also had redness, mild photophobia. Wakes up with crusting to the eye. Denies blurry vision. Denies contact lens, glasses use. Denies URI symptoms such as cough, congestion, sore throat. Denies fever, chills, night sweats. Tried otc red eye drops without relief.      History reviewed. No pertinent past medical history.  There are no active problems to display for this patient.   Past Surgical History:  Procedure Laterality Date  . LEG SURGERY         Home Medications    Prior to Admission medications   Medication Sig Start Date End Date Taking? Authorizing Provider  diphenhydramine-acetaminophen (TYLENOL PM) 25-500 MG TABS Take 1 tablet by mouth at bedtime.    [provider]  ofloxacin (OCUFLOX) 0.3 % ophthalmic solution 1 drop every 4 hours for 2 days, then 4 times a day for next 5 days 09/28/17   Cathie Hoops, Amy V, PA-C  Polyethyl Glycol-Propyl Glycol (SYSTANE) 0.4-0.3 % GEL ophthalmic gel Place 1 application into both eyes at bedtime as needed. 09/28/17   Belinda Fisher, PA-C    Family History History reviewed. No pertinent family history.  Social History Social History   Tobacco Use  . Smoking status: Current Every Day Smoker  Substance Use Topics  . Alcohol use: Yes  . Drug use: No     Allergies   Patient has no known allergies.   Review of Systems Review of Systems  Reason unable to perform ROS: See HPI as above.     Physical Exam Triage Vital Signs ED Triage Vitals [09/28/17 1330]  Enc Vitals Group     BP 113/81     Pulse Rate 73     Resp 18     Temp 98.3 F (36.8 C)     Temp Source Oral     SpO2 100 %     Weight      Height      Head Circumference    Peak Flow      Pain Score      Pain Loc      Pain Edu?      Excl. in GC?    No data found.  Updated Vital Signs BP 113/81 (BP Location: Left Arm)   Pulse 73   Temp 98.3 F (36.8 C) (Oral)   Resp 18   SpO2 100%   Visual Acuity Right Eye Distance:   Left Eye Distance:   Bilateral Distance:    Right Eye Near:   Left Eye Near:    Bilateral Near:     Physical Exam  Constitutional: He is oriented to person, place, and time. He appears well-developed and well-nourished. No distress.  HENT:  Head: Normocephalic and atraumatic.  Eyes: Pupils are equal, round, and reactive to light. EOM and lids are normal. Lids are everted and swept, no foreign bodies found. Right conjunctiva is not injected. Left conjunctiva is injected.  No ciliary injection.   Neck: Normal range of motion. Neck supple.  Neurological: He is alert and oriented to person, place, and time.  Skin: Skin is warm and dry.    UC Treatments / Results  Labs (all labs ordered are listed, but only abnormal results are displayed) Labs Reviewed - No data to display  EKG None  Radiology No results found.  Procedures Procedures (including critical care time)  Medications Ordered in UC Medications - No data to display  Initial Impression / Assessment and Plan / UC Course  I have reviewed the triage vital signs and the nursing notes.  Pertinent labs & imaging results that were available during my care of the patient were reviewed by me and considered in my medical decision making (see chart for details).    Start ofloxacin drops as directed. Artificial tears gel as directed. Lid scrubs and warm compresses as directed. Patient to follow up with ophthalmology if symptoms worsens or does not improve. Return precautions given.   Final Clinical Impressions(s) / UC Diagnoses   Final diagnoses:  Acute conjunctivitis of left eye, unspecified acute conjunctivitis type    ED Prescriptions    Medication Sig Dispense  Auth. Provider   ofloxacin (OCUFLOX) 0.3 % ophthalmic solution 1 drop every 4 hours for 2 days, then 4 times a day for next 5 days 5 mL Yu, Amy V, PA-C   Polyethyl Glycol-Propyl Glycol (SYSTANE) 0.4-0.3 % GEL ophthalmic gel Place 1 application into both eyes at bedtime as needed. 1 Bottle Threasa Alpha, [place] 09/28/17 1752

## 2017-09-28 NOTE — ED Notes (Signed)
Visual acuity:  Right: 20/25 Left: 20/30 Both 20/30

## 2017-09-28 NOTE — Discharge Instructions (Signed)
Use ofloxacin eyedrops as directed on left eye. Artificial tear gel at night. Wait 10-15 minutes between drops, always use artificial tear gel last, as it prevents drops from penetrating through. Warm compresses as directed. Monitor for any worsening of symptoms, changes in vision, sensitivity to light, eye swelling, painful eye movement, follow up with ophthalmology for further evaluation.

## 2017-09-29 ENCOUNTER — Telehealth (HOSPITAL_COMMUNITY): Payer: Self-pay | Admitting: Emergency Medicine

## 2017-09-29 NOTE — Telephone Encounter (Signed)
Patient came in requesting another day off.  Eye is no worse and patient reports eye as slightly better.  Patient feels it is not enough improvement for going to work. Patient is using medication.    Spoke to amy yu, pa ( saw patient initially) agreed to another day off.  Provided work note as instructed

## 2017-10-04 ENCOUNTER — Other Ambulatory Visit: Payer: Self-pay

## 2017-10-04 ENCOUNTER — Encounter (HOSPITAL_COMMUNITY): Payer: Self-pay | Admitting: Emergency Medicine

## 2017-10-04 ENCOUNTER — Emergency Department (HOSPITAL_COMMUNITY)
Admission: EM | Admit: 2017-10-04 | Discharge: 2017-10-04 | Disposition: A | Discharge status: Home / Self Care | Payer: Self-pay | Attending: Emergency Medicine | Admitting: Emergency Medicine

## 2017-10-04 DIAGNOSIS — Z79899 Other long term (current) drug therapy: Secondary | ICD-10-CM | POA: Insufficient documentation

## 2017-10-04 DIAGNOSIS — F17210 Nicotine dependence, cigarettes, uncomplicated: Secondary | ICD-10-CM | POA: Insufficient documentation

## 2017-10-04 DIAGNOSIS — L03213 Periorbital cellulitis: Secondary | ICD-10-CM

## 2017-10-04 DIAGNOSIS — H109 Unspecified conjunctivitis: Secondary | ICD-10-CM

## 2017-10-04 DIAGNOSIS — S0502XA Injury of conjunctiva and corneal abrasion without foreign body, left eye, initial encounter: Secondary | ICD-10-CM

## 2017-10-04 MED ORDER — TETANUS-DIPHTH-ACELL PERTUSSIS 5-2.5-18.5 LF-MCG/0.5 IM SUSP
0.5000 mL | Freq: Once | INTRAMUSCULAR | Status: AC
  Administered 2017-10-04: 0.5 mL via INTRAMUSCULAR
  Filled 2017-10-04: qty 0.5

## 2017-10-04 MED ORDER — TOBRAMYCIN-DEXAMETHASONE 0.3-0.1 % OP OINT
TOPICAL_OINTMENT | Freq: Four times a day (QID) | OPHTHALMIC | Status: DC
  Administered 2017-10-04: 21:00:00 via OPHTHALMIC
  Filled 2017-10-04: qty 3.5

## 2017-10-04 MED ORDER — SULFAMETHOXAZOLE-TRIMETHOPRIM 800-160 MG PO TABS: 1.0000 | ORAL_TABLET | Freq: Two times a day (BID) | ORAL | 0 refills | Status: AC

## 2017-10-04 MED ORDER — TETRACAINE HCL 0.5 % OP SOLN
2.0000 [drp] | Freq: Once | OPHTHALMIC | Status: AC
  Administered 2017-10-04: 2 [drp] via OPHTHALMIC
  Filled 2017-10-04: qty 4

## 2017-10-04 MED ORDER — SULFAMETHOXAZOLE-TRIMETHOPRIM 800-160 MG PO TABS
1.0000 | ORAL_TABLET | Freq: Once | ORAL | Status: AC
  Administered 2017-10-04: 1 via ORAL
  Filled 2017-10-04: qty 1

## 2017-10-04 MED ORDER — FLUORESCEIN SODIUM 1 MG OP STRP
1.0000 | ORAL_STRIP | Freq: Once | OPHTHALMIC | Status: AC
  Administered 2017-10-04: 1 via OPHTHALMIC
  Filled 2017-10-04: qty 1

## 2017-10-04 MED ORDER — CEPHALEXIN 250 MG PO CAPS
500.0000 mg | ORAL_CAPSULE | Freq: Once | ORAL | Status: AC
  Administered 2017-10-04: 500 mg via ORAL
  Filled 2017-10-04: qty 2

## 2017-10-04 MED ORDER — CEPHALEXIN 500 MG PO CAPS: 500.0000 mg | ORAL_CAPSULE | Freq: Four times a day (QID) | ORAL | 0 refills | Status: AC

## 2017-10-04 NOTE — ED Triage Notes (Signed)
Pt was seen at UC last week for same issue given antibiotics.  Pt finished treatment.  Pt continues to have discharge and redness of the L eye. L eye is "very blurry and the eye is burning".

## 2017-10-04 NOTE — Discharge Instructions (Addendum)
To use your eyedrops put 2 drops in your left eye 4 times a day for 5 days.  Please take Ibuprofen (Advil, motrin) and Tylenol (acetaminophen) to relieve your pain.  You may take up to 600 MG (3 pills) of normal strength ibuprofen every 8 hours as needed.  In between doses of ibuprofen you make take tylenol, up to 1,000 mg (two extra strength pills).  Do not take more than 3,000 mg tylenol in a 24 hour period.  Please check all medication labels as many medications such as pain and cold medications may contain tylenol.  Do not drink alcohol while taking these medications.  Do not take other NSAID'S while taking ibuprofen (such as aleve or naproxen).  Please take ibuprofen with food to decrease stomach upset.  You may have diarrhea from the antibiotics.  It is very important that you continue to take the antibiotics even if you get diarrhea unless a medical professional tells you that you may stop taking them.  If you stop too early the bacteria you are being treated for will become stronger and you may need different, more powerful antibiotics that have more side effects and worsening diarrhea.  Please stay well hydrated and consider probiotics as they may decrease the severity of your diarrhea.
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
## 2018-10-09 ENCOUNTER — Emergency Department (HOSPITAL_COMMUNITY)
Admission: EM | Admit: 2018-10-09 | Discharge: 2018-10-09 | Disposition: A | Discharge status: Home / Self Care | Payer: Self-pay | Attending: Emergency Medicine | Admitting: Emergency Medicine

## 2018-10-09 ENCOUNTER — Other Ambulatory Visit: Payer: Self-pay

## 2018-10-09 ENCOUNTER — Emergency Department (HOSPITAL_COMMUNITY): Payer: Self-pay

## 2018-10-09 DIAGNOSIS — Y929 Unspecified place or not applicable: Secondary | ICD-10-CM | POA: Insufficient documentation

## 2018-10-09 DIAGNOSIS — S43101A Unspecified dislocation of right acromioclavicular joint, initial encounter: Secondary | ICD-10-CM

## 2018-10-09 DIAGNOSIS — W010XXA Fall on same level from slipping, tripping and stumbling without subsequent striking against object, initial encounter: Secondary | ICD-10-CM | POA: Insufficient documentation

## 2018-10-09 DIAGNOSIS — F17200 Nicotine dependence, unspecified, uncomplicated: Secondary | ICD-10-CM | POA: Insufficient documentation

## 2018-10-09 DIAGNOSIS — Y939 Activity, unspecified: Secondary | ICD-10-CM | POA: Insufficient documentation

## 2018-10-09 DIAGNOSIS — Y999 Unspecified external cause status: Secondary | ICD-10-CM | POA: Insufficient documentation

## 2018-10-09 DIAGNOSIS — S0003XA Contusion of scalp, initial encounter: Secondary | ICD-10-CM

## 2018-10-09 DIAGNOSIS — S40211A Abrasion of right shoulder, initial encounter: Secondary | ICD-10-CM

## 2018-10-09 DIAGNOSIS — Z79899 Other long term (current) drug therapy: Secondary | ICD-10-CM | POA: Insufficient documentation

## 2018-10-09 MED ORDER — ACETAMINOPHEN 500 MG PO TABS
1000.0000 mg | ORAL_TABLET | Freq: Once | ORAL | Status: AC
  Administered 2018-10-09: 1000 mg via ORAL
  Filled 2018-10-09: qty 2

## 2018-10-09 MED ORDER — MORPHINE SULFATE 15 MG PO TABS: 30.0000 mg | ORAL_TABLET | ORAL | 0 refills | Status: AC | PRN

## 2018-10-09 MED ORDER — OXYCODONE HCL 5 MG PO TABS
5.0000 mg | ORAL_TABLET | Freq: Once | ORAL | Status: AC
  Administered 2018-10-09: 5 mg via ORAL
  Filled 2018-10-09: qty 1
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
## 2018-10-09 NOTE — Discharge Instructions (Signed)

## 2018-10-09 NOTE — ED Notes (Signed)
Patient verbalizes understanding of discharge instructions . Opportunity for questions and answers were provided . Armband removed by staff ,Pt discharged from ED. W/C  offered at D/C  and Declined W/C at D/C and was escorted to lobby by RN.  

## 2018-10-19 ENCOUNTER — Ambulatory Visit (HOSPITAL_COMMUNITY)
Admission: EM | Admit: 2018-10-19 | Discharge: 2018-10-19 | Disposition: A | Discharge status: Home / Self Care | Payer: Self-pay

## 2018-10-19 NOTE — ED Notes (Signed)
Patient wanting clearance to return to work, Hassan Rowan EMT spoke with patient and offered him options of following up with orthopedic or with occupational health.  Patient verbalized understanding, and was leaving to go to occupational health.

## 2021-04-26 IMAGING — CR DG SHOULDER 2+V*R*
2 series · 2 of 2 positions shown · non-contrast
Comparison: None.

CLINICAL DATA: Fall onto right shoulder with pain.

EXAM:
RIGHT SHOULDER - 2+ VIEW

[shoulder grashey]
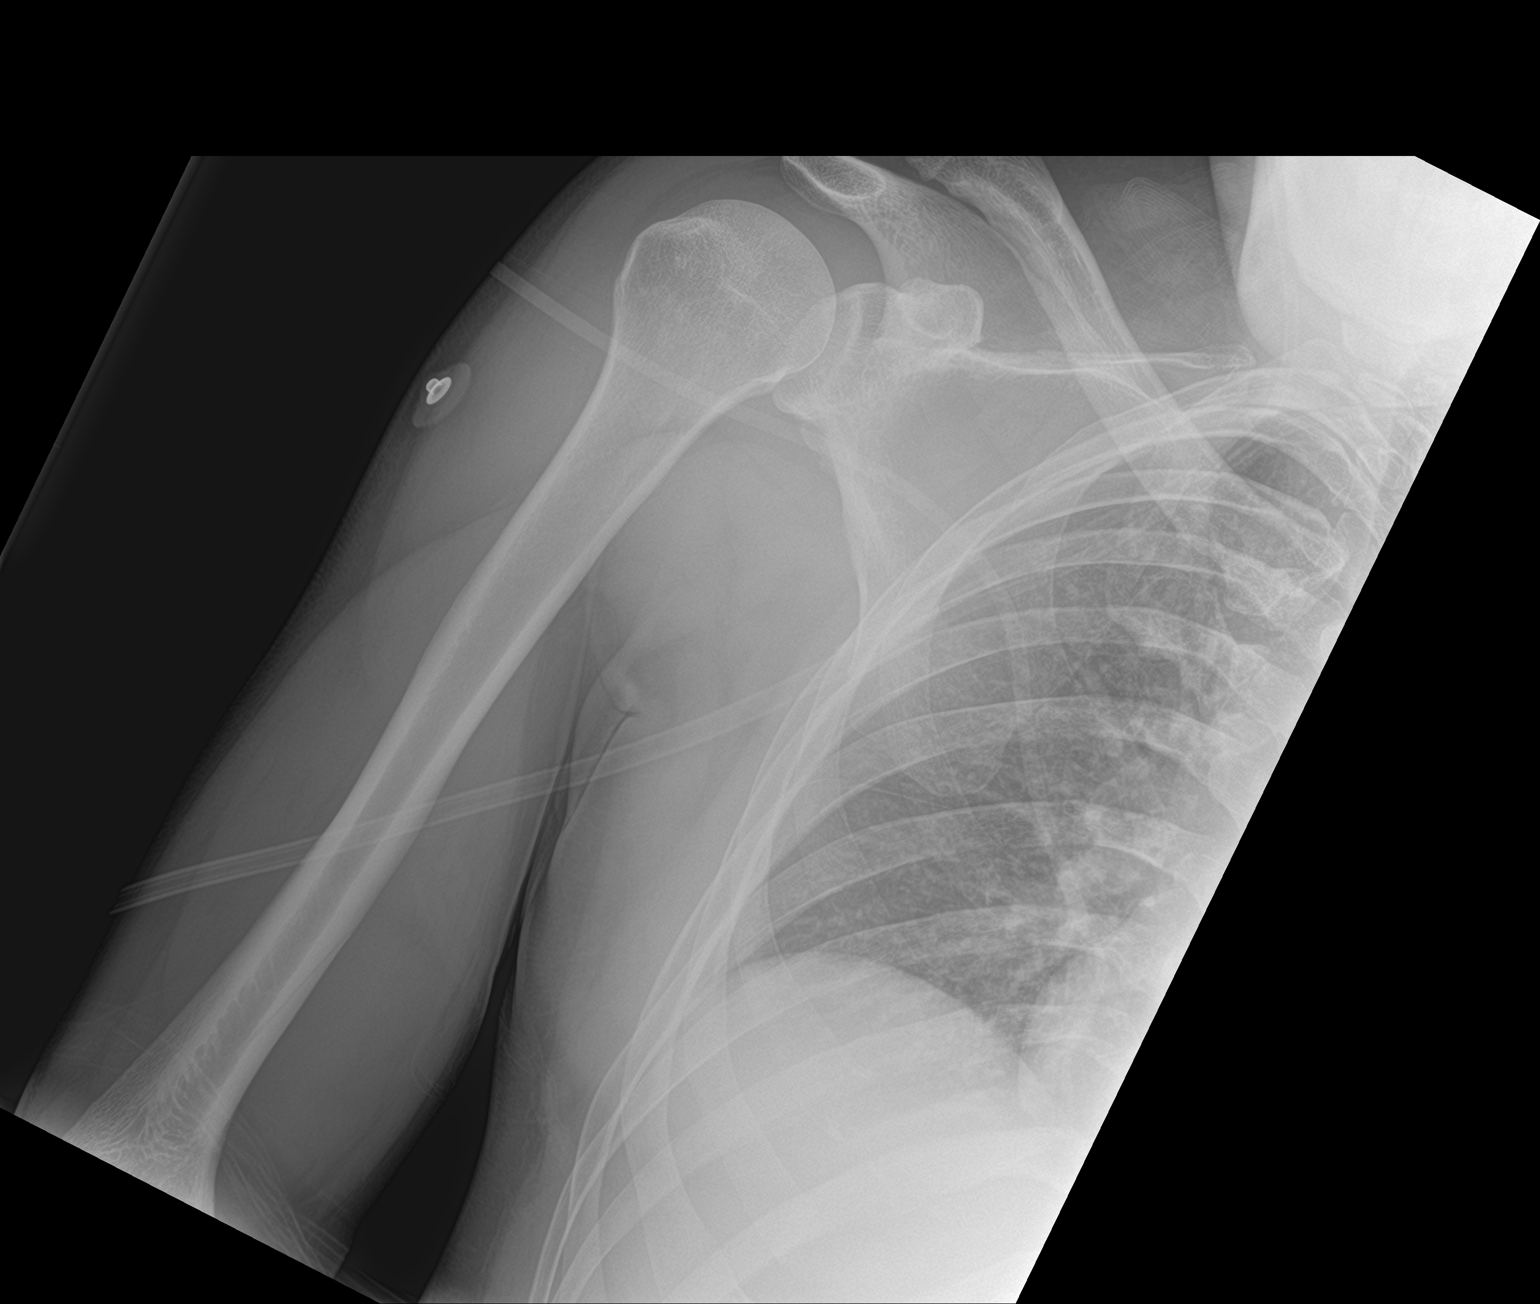

[shoulder y view]
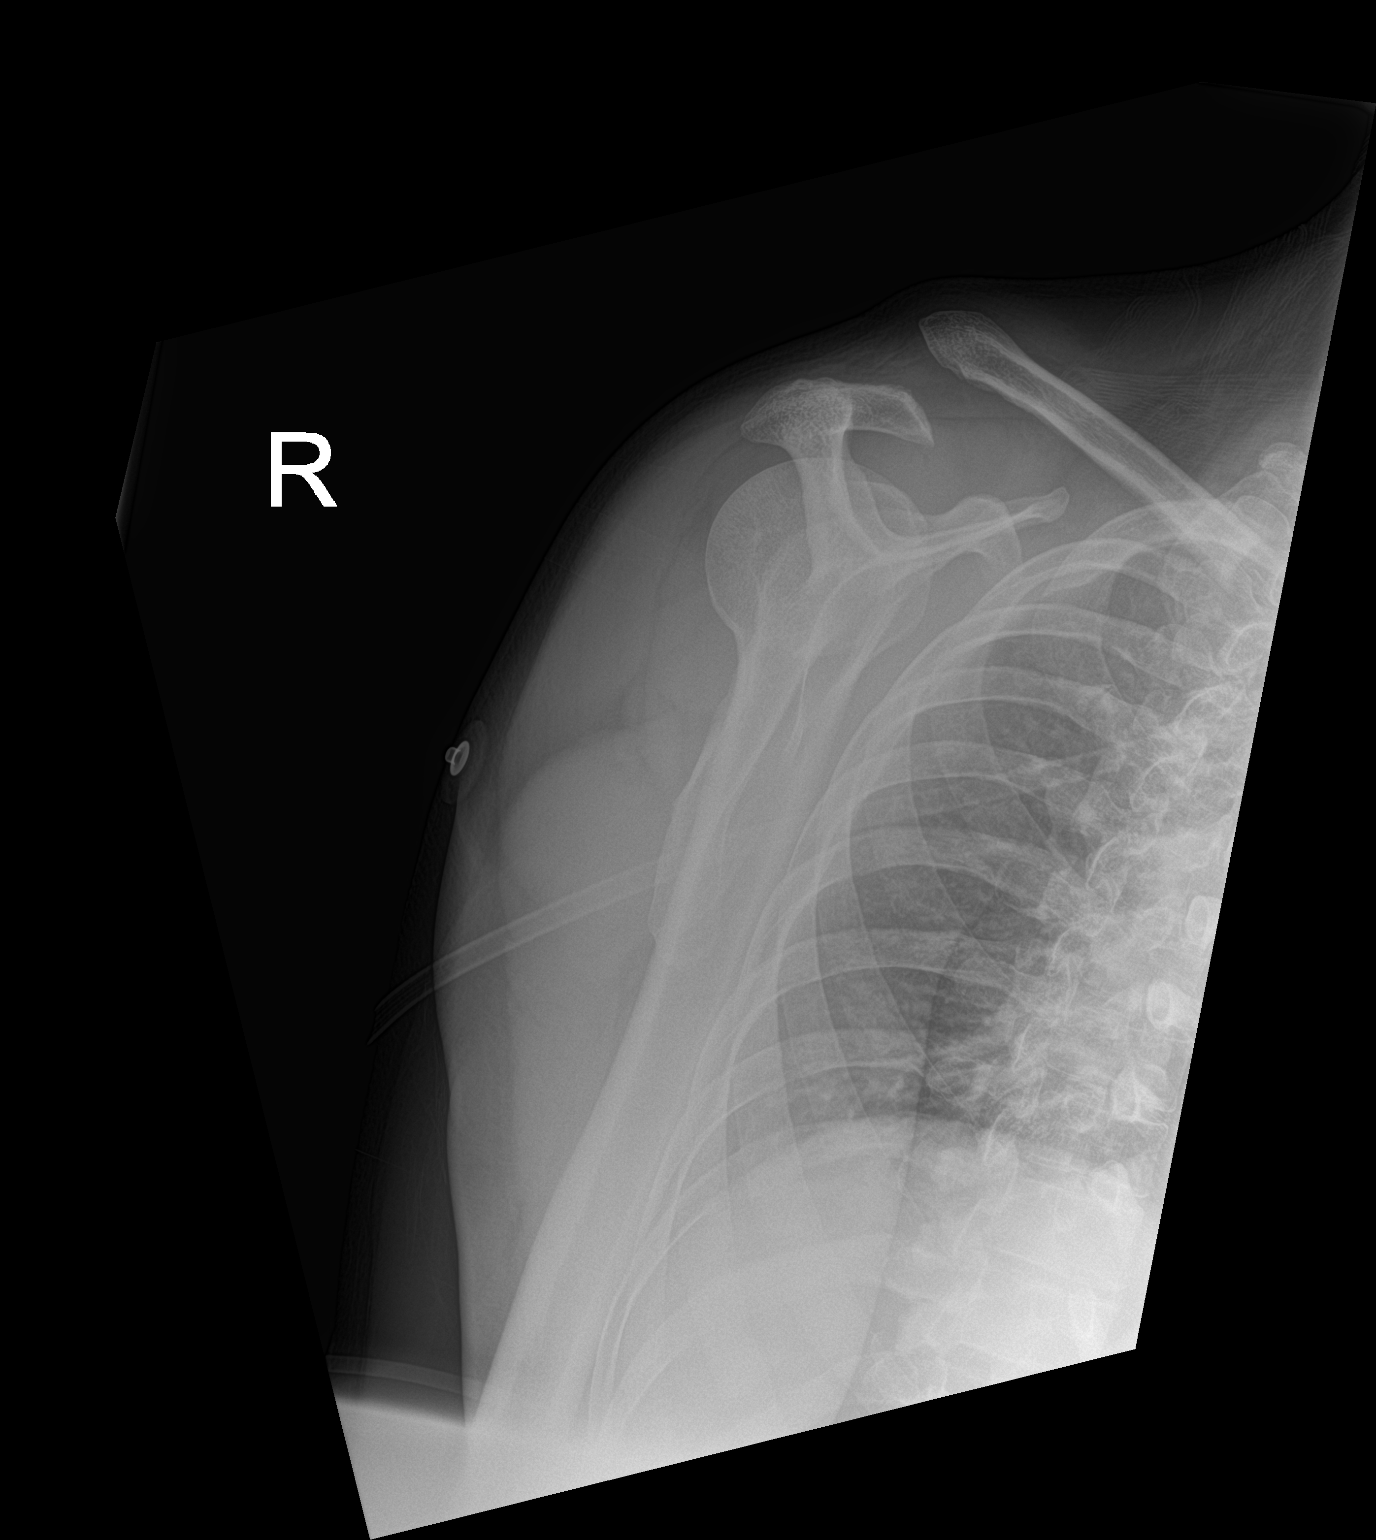

[2 of 2 positions shown; findings below may reference images not displayed]

FINDINGS: Examination demonstrates widening of the AC joint with the distal
clavicle subluxed superiorly by 1.5 shaft's width compatible grade 3
AC joint separation. No acute fracture. Glenohumeral joint is within
normal.
IMPRESSION: No acute fracture.  Evidence of grade 3 AC joint separation.
